# Patient Record
Sex: Female | Born: 1975 | Race: White | Hispanic: No | Smoking: Current every day smoker
Health system: Southern US, Community
[De-identification: ages and names within clinical notes are randomized; demographics above are authoritative.]

## PROBLEM LIST (undated history)

## (undated) DIAGNOSIS — N289 Disorder of kidney and ureter, unspecified: Secondary | ICD-10-CM

## (undated) DIAGNOSIS — F32A Depression, unspecified: Secondary | ICD-10-CM

## (undated) DIAGNOSIS — F419 Anxiety disorder, unspecified: Secondary | ICD-10-CM

## (undated) DIAGNOSIS — F329 Major depressive disorder, single episode, unspecified: Secondary | ICD-10-CM

---

## 2014-06-23 ENCOUNTER — Emergency Department: Payer: Self-pay | Admitting: Emergency Medicine

## 2014-06-23 LAB — BASIC METABOLIC PANEL
Anion Gap: 5 — ABNORMAL LOW (ref 7–16)
BUN: 8 mg/dL (ref 7–18)
CALCIUM: 8.4 mg/dL — AB (ref 8.5–10.1)
CHLORIDE: 102 mmol/L (ref 98–107)
CO2: 32 mmol/L (ref 21–32)
CREATININE: 0.78 mg/dL (ref 0.60–1.30)
EGFR (Non-African Amer.): >60
GLUCOSE: 96 mg/dL (ref 65–99)
Osmolality: 276 (ref 275–301)
Potassium: 3.9 mmol/L (ref 3.5–5.1)
SODIUM: 139 mmol/L (ref 136–145)

## 2014-06-23 LAB — CBC
HCT: 40.9 % (ref 35.0–47.0)
HGB: 12.9 g/dL (ref 12.0–16.0)
MCH: 26.8 pg (ref 26.0–34.0)
MCHC: 31.5 g/dL — AB (ref 32.0–36.0)
MCV: 85 fL (ref 80–100)
Platelet: 510 10*3/uL — ABNORMAL HIGH (ref 150–440)
RBC: 4.79 10*6/uL (ref 3.80–5.20)
RDW: 13.6 % (ref 11.5–14.5)
WBC: 12.6 10*3/uL — ABNORMAL HIGH (ref 3.6–11.0)

## 2014-06-23 LAB — TROPONIN I

## 2014-06-24 LAB — TROPONIN I: Troponin-I: <0.02 ng/mL

## 2014-10-03 ENCOUNTER — Emergency Department: Payer: Self-pay | Admitting: Emergency Medicine

## 2014-10-03 LAB — URINALYSIS, COMPLETE
BLOOD: NEGATIVE
Bacteria: NONE SEEN
Bilirubin,UR: NEGATIVE
Glucose,UR: NEGATIVE mg/dL (ref 0–75)
KETONE: NEGATIVE
Leukocyte Esterase: NEGATIVE
NITRITE: NEGATIVE
PH: 5 (ref 4.5–8.0)
Protein: NEGATIVE
RBC,UR: 3 /HPF (ref 0–5)
Specific Gravity: 1.019 (ref 1.003–1.030)
Squamous Epithelial: 6
WBC UR: 4 /HPF (ref 0–5)

## 2014-10-03 LAB — HCG, QUANTITATIVE, PREGNANCY

## 2014-10-03 LAB — CBC
HCT: 38.3 % (ref 35.0–47.0)
HGB: 12 g/dL (ref 12.0–16.0)
MCH: 23.9 pg — ABNORMAL LOW (ref 26.0–34.0)
MCHC: 31.3 g/dL — ABNORMAL LOW (ref 32.0–36.0)
MCV: 76 fL — ABNORMAL LOW (ref 80–100)
Platelet: 562 10*3/uL — ABNORMAL HIGH (ref 150–440)
RBC: 5.01 10*6/uL (ref 3.80–5.20)
RDW: 16 % — ABNORMAL HIGH (ref 11.5–14.5)
WBC: 10.7 10*3/uL (ref 3.6–11.0)

## 2014-12-26 ENCOUNTER — Emergency Department: Payer: Self-pay | Admitting: Student

## 2014-12-26 LAB — COMPREHENSIVE METABOLIC PANEL
ALBUMIN: 3.8 g/dL (ref 3.4–5.0)
ALK PHOS: 82 U/L
Anion Gap: 8 (ref 7–16)
BILIRUBIN TOTAL: 0.2 mg/dL (ref 0.2–1.0)
BUN: 12 mg/dL (ref 7–18)
CALCIUM: 8.8 mg/dL (ref 8.5–10.1)
CO2: 27 mmol/L (ref 21–32)
Chloride: 106 mmol/L (ref 98–107)
Creatinine: 0.8 mg/dL (ref 0.60–1.30)
EGFR (African American): >60
Glucose: 93 mg/dL (ref 65–99)
Osmolality: 281 (ref 275–301)
POTASSIUM: 3.3 mmol/L — AB (ref 3.5–5.1)
SGOT(AST): 18 U/L (ref 15–37)
SGPT (ALT): 25 U/L
SODIUM: 141 mmol/L (ref 136–145)
Total Protein: 7.4 g/dL (ref 6.4–8.2)

## 2014-12-26 LAB — URINALYSIS, COMPLETE
BACTERIA: NONE SEEN
Bilirubin,UR: NEGATIVE
Glucose,UR: NEGATIVE mg/dL (ref 0–75)
Ketone: NEGATIVE
Leukocyte Esterase: NEGATIVE
NITRITE: NEGATIVE
Ph: 7 (ref 4.5–8.0)
Protein: NEGATIVE
RBC,UR: 1 /HPF (ref 0–5)
Specific Gravity: 1.006 (ref 1.003–1.030)
Squamous Epithelial: 1
WBC UR: <1 /HPF (ref 0–5)

## 2014-12-26 LAB — CBC
HCT: 32.8 % — ABNORMAL LOW (ref 35.0–47.0)
HGB: 9.8 g/dL — AB (ref 12.0–16.0)
MCH: 19.5 pg — AB (ref 26.0–34.0)
MCHC: 30 g/dL — ABNORMAL LOW (ref 32.0–36.0)
MCV: 65 fL — ABNORMAL LOW (ref 80–100)
PLATELETS: 513 10*3/uL — AB (ref 150–440)
RBC: 5.03 10*6/uL (ref 3.80–5.20)
RDW: 17.2 % — ABNORMAL HIGH (ref 11.5–14.5)
WBC: 9.5 10*3/uL (ref 3.6–11.0)

## 2014-12-26 LAB — PREGNANCY, URINE: Pregnancy Test, Urine: NEGATIVE m[IU]/mL

## 2015-05-04 ENCOUNTER — Emergency Department: Payer: Self-pay

## 2015-05-04 ENCOUNTER — Emergency Department
Admission: EM | Admit: 2015-05-04 | Discharge: 2015-05-04 | Disposition: A | Discharge status: Home / Self Care | Payer: Self-pay | Attending: Emergency Medicine | Admitting: Emergency Medicine

## 2015-05-04 ENCOUNTER — Encounter: Payer: Self-pay | Admitting: Emergency Medicine

## 2015-05-04 DIAGNOSIS — Z720 Tobacco use: Secondary | ICD-10-CM | POA: Insufficient documentation

## 2015-05-04 DIAGNOSIS — Z79899 Other long term (current) drug therapy: Secondary | ICD-10-CM | POA: Insufficient documentation

## 2015-05-04 DIAGNOSIS — N12 Tubulo-interstitial nephritis, not specified as acute or chronic: Secondary | ICD-10-CM | POA: Insufficient documentation

## 2015-05-04 HISTORY — DX: Disorder of kidney and ureter, unspecified: N28.9

## 2015-05-04 LAB — COMPREHENSIVE METABOLIC PANEL
ALK PHOS: 74 U/L (ref 38–126)
ALT: 13 U/L — ABNORMAL LOW (ref 14–54)
AST: 19 U/L (ref 15–41)
Albumin: 4.3 g/dL (ref 3.5–5.0)
Anion gap: 6 (ref 5–15)
BUN: 11 mg/dL (ref 6–20)
CALCIUM: 9.1 mg/dL (ref 8.9–10.3)
CHLORIDE: 106 mmol/L (ref 101–111)
CO2: 25 mmol/L (ref 22–32)
Creatinine, Ser: 0.82 mg/dL (ref 0.44–1.00)
GFR calc Af Amer: >60 mL/min (ref >60)
GLUCOSE: 131 mg/dL — AB (ref 65–99)
POTASSIUM: 3.8 mmol/L (ref 3.5–5.1)
SODIUM: 137 mmol/L (ref 135–145)
Total Bilirubin: 0.4 mg/dL (ref 0.3–1.2)
Total Protein: 7.6 g/dL (ref 6.5–8.1)

## 2015-05-04 LAB — CBC
HCT: 33.8 % — ABNORMAL LOW (ref 35.0–47.0)
HEMOGLOBIN: 9.9 g/dL — AB (ref 12.0–16.0)
MCH: 18.1 pg — AB (ref 26.0–34.0)
MCHC: 29.4 g/dL — ABNORMAL LOW (ref 32.0–36.0)
MCV: 61.7 fL — ABNORMAL LOW (ref 80.0–100.0)
Platelets: 556 10*3/uL — ABNORMAL HIGH (ref 150–440)
RBC: 5.48 MIL/uL — AB (ref 3.80–5.20)
RDW: 20 % — ABNORMAL HIGH (ref 11.5–14.5)
WBC: 10.8 10*3/uL (ref 3.6–11.0)

## 2015-05-04 LAB — URINALYSIS COMPLETE WITH MICROSCOPIC (ARMC ONLY)
Bilirubin Urine: NEGATIVE
GLUCOSE, UA: NEGATIVE mg/dL
HGB URINE DIPSTICK: NEGATIVE
KETONES UR: NEGATIVE mg/dL
Nitrite: NEGATIVE
Protein, ur: 30 mg/dL — AB
Specific Gravity, Urine: 1.015 (ref 1.005–1.030)
pH: 5 (ref 5.0–8.0)

## 2015-05-04 MED ORDER — DEXTROSE 5 % IV SOLN
1.0000 g | Freq: Once | INTRAVENOUS | Status: DC
  Filled 2015-05-04: qty 10

## 2015-05-04 MED ORDER — HYDROCODONE-ACETAMINOPHEN 5-325 MG PO TABS
ORAL_TABLET | ORAL | Status: AC
  Administered 2015-05-04: 1 via ORAL
  Filled 2015-05-04: qty 1

## 2015-05-04 MED ORDER — SODIUM CHLORIDE 0.9 % IV BOLUS (SEPSIS)
1000.0000 mL | Freq: Once | INTRAVENOUS | Status: AC
  Administered 2015-05-04: 1000 mL via INTRAVENOUS

## 2015-05-04 MED ORDER — CEFTRIAXONE SODIUM IN DEXTROSE 20 MG/ML IV SOLN
INTRAVENOUS | Status: AC
  Administered 2015-05-04: 1000 mg
  Filled 2015-05-04: qty 50

## 2015-05-04 MED ORDER — AMOXICILLIN-POT CLAVULANATE 875-125 MG PO TABS: 1.0000 | ORAL_TABLET | Freq: Two times a day (BID) | ORAL | Status: AC

## 2015-05-04 MED ORDER — MORPHINE SULFATE 4 MG/ML IJ SOLN
INTRAMUSCULAR | Status: AC
  Administered 2015-05-04: 4 mg via INTRAVENOUS
  Filled 2015-05-04: qty 1

## 2015-05-04 MED ORDER — HYDROCODONE-ACETAMINOPHEN 5-325 MG PO TABS: 1.0000 | ORAL_TABLET | Freq: Four times a day (QID) | ORAL | Status: AC | PRN

## 2015-05-04 MED ORDER — MORPHINE SULFATE 4 MG/ML IJ SOLN
4.0000 mg | Freq: Once | INTRAMUSCULAR | Status: AC
  Administered 2015-05-04: 4 mg via INTRAVENOUS

## 2015-05-04 MED ORDER — HYDROCODONE-ACETAMINOPHEN 5-325 MG PO TABS
1.0000 | ORAL_TABLET | Freq: Once | ORAL | Status: AC
  Administered 2015-05-04: 1 via ORAL

## 2015-05-04 NOTE — ED Notes (Signed)
C/o right flank pain with painful urination and hematuria, states she has a hx of kidney problems and is supposed to be seeing nephrologist but just recently moved here

## 2015-05-04 NOTE — Discharge Instructions (Signed)
Take motrin for pain.   Take lortab for severe pain. Do NOT drive with it.  Take augmentin for a week.   Follow up with a doctor here.   Return to ER if you have severe pain, vomiting, fever.

## 2015-05-04 NOTE — ED Provider Notes (Signed)
CSN: [id number]     Arrival date & time 05/04/15  1538 History   First MD Initiated Contact with Patient 05/04/15 1619     Chief Complaint  Patient presents with  . Flank Pain  . Hematuria  . Dysuria     (Consider location/radiation/quality/duration/timing/severity/associated sxs/prior Treatment) The history is provided by the patient.  Mary Morales is a 39 y.o. female hx medullary sponge kidney, anxiety here presenting with right flank pain, dysuria. Dysuria about a week and a half ago and resolved several days ago. Yesterday she had acute onset of worsening dysuria as well as frequency. Also has worsening right sided flank pain. Had some dark urine but denies any history of kidney stones. Denies any vomiting or fevers. Of note patient was diagnosed with medullary sponge kidney in [place] and had to relocate here so has no follow-up currently.   Past Medical History  Diagnosis Date  . Kidney disease    No past surgical history on file. No family history on file. History  Substance Use Topics  . Smoking status: Current Every Day Smoker    Types: Cigarettes  . Smokeless tobacco: Not on file  . Alcohol Use: Yes   OB History    No data available     Review of Systems  Genitourinary: Positive for dysuria, hematuria and flank pain.  All other systems reviewed and are negative.     Allergies  Buspar  Home Medications   Prior to Admission medications   Medication Sig Start Date End Date Taking? Authorizing Provider  amphetamine-dextroamphetamine (ADDERALL) 15 MG tablet Take 15 mg by mouth daily.   Yes Historical Provider, MD  azelastine (ASTELIN) 0.1 % nasal spray Place 2 sprays into both nostrils 2 (two) times daily. Use in each nostril as directed   Yes Historical Provider, MD  FLUoxetine (PROZAC) 20 MG capsule Take 20 mg by mouth daily.   Yes Historical Provider, MD  Multiple Vitamins-Minerals (MULTIVITAMIN ADULT) TABS Take 1 tablet by mouth daily.   Yes Historical  Provider, MD   BP 126/88 mmHg  Pulse 87  Temp(Src) 98.3 F (36.8 C) (Oral)  Resp 18  Ht  (1.778 m)  Wt 178 lb (80.74 kg)  BMI 25.54 kg/m2  SpO2 97%  LMP 04/27/2015 Physical Exam  Constitutional: She is oriented to person, place, and time.  Slightly uncomfortable   HENT:  Head: Normocephalic.  Mouth/Throat: Oropharynx is clear and moist.  Eyes: Conjunctivae are normal. Pupils are equal, round, and reactive to light.  Neck: Normal range of motion. Neck supple.  Cardiovascular: Normal rate, regular rhythm and normal heart sounds.   Pulmonary/Chest: Effort normal and breath sounds normal. No respiratory distress. She has no wheezes. She has no rales.  Abdominal: Soft. Bowel sounds are normal.  +mild suprapubic tenderness. Mild R CVAT   Musculoskeletal: Normal range of motion. She exhibits no edema or tenderness.  Neurological: She is alert and oriented to person, place, and time. No cranial nerve deficit. Coordination normal.  Skin: Skin is warm and dry.  Psychiatric: She has a normal mood and affect. Her behavior is normal. Judgment and thought content normal.  Nursing note and vitals reviewed.   ED Course  Procedures (including critical care time) Labs Review Labs Reviewed  CBC - Abnormal; Notable for the following:    RBC 5.48 (*)    Hemoglobin 9.9 (*)    HCT 33.8 (*)    MCV 61.7 (*)    MCH 18.1 (*)    MCHC  29.4 (*)    RDW 20.0 (*)    Platelets 556 (*)    All other components within normal limits  URINALYSIS COMPLETEWITH MICROSCOPIC ([facility] ONLY) - Abnormal; Notable for the following:    Color, Urine YELLOW (*)    APPearance HAZY (*)    Protein, ur 30 (*)    Leukocytes, UA 2+ (*)    Bacteria, UA RARE (*)    Squamous Epithelial / LPF 6-30 (*)    All other components within normal limits  COMPREHENSIVE METABOLIC PANEL - Abnormal; Notable for the following:    Glucose, Bld 131 (*)    ALT 13 (*)    All other components within normal limits    Imaging  Review [facility]  05/04/2015   CLINICAL DATA:  Right flank pain for 2 weeks. Gross hematuria. History of medullary sponge kidney. Initial encounter.  EXAM: RENAL / URINARY TRACT ULTRASOUND COMPLETE  COMPARISON:  Abdominal CT 07/04/2011.  FINDINGS: Right Kidney:  Length: 12.4 cm. There is increased echogenicity within the medullary portion of the kidney without discrete calculi. No cortical lesion or hydronephrosis.  Left Kidney:  Length: 12.4 cm. There is increased echogenicity within the medullary portion of the kidney without discrete calculi. No cortical lesion or hydronephrosis.  Bladder:  Appears normal for degree of bladder distention.  IMPRESSION: 1. Both kidneys demonstrate increased echogenicity within the medullary portions which may be related to known medullary sponge kidney. No discrete calculi identified. 2. No evidence of hydronephrosis.   Electronically Signed   By: Carey BullocksWilliam  Veazey M.D.   On: 05/04/2015 17:42     EKG Interpretation None      MDM   Final diagnoses:  None    Mary PianoHolly Croy is a 39 y.o. female here with R flank pain. Likely pyelo vs renal colic. Able to review outside records. Had CT in 2014 that was unremarkable. US renal showed medullary sponge kidney but didn't get a biopsy yet. Will get labs, UA, US renal.   6:21 PM UA + UTI. US showed no stone. Pain controlled. Will dc home with augmentin, lortab.    Richardean Canalavid H Yao, MD 05/04/15 [phone]

## 2016-04-16 ENCOUNTER — Encounter: Payer: Self-pay | Admitting: Emergency Medicine

## 2016-04-16 ENCOUNTER — Emergency Department: Payer: Self-pay

## 2016-04-16 ENCOUNTER — Emergency Department
Admission: EM | Admit: 2016-04-16 | Discharge: 2016-04-16 | Disposition: A | Discharge status: Home / Self Care | Payer: Self-pay | Attending: Emergency Medicine | Admitting: Emergency Medicine

## 2016-04-16 DIAGNOSIS — F32A Depression, unspecified: Secondary | ICD-10-CM

## 2016-04-16 DIAGNOSIS — R0789 Other chest pain: Secondary | ICD-10-CM | POA: Insufficient documentation

## 2016-04-16 DIAGNOSIS — Z792 Long term (current) use of antibiotics: Secondary | ICD-10-CM | POA: Insufficient documentation

## 2016-04-16 DIAGNOSIS — F329 Major depressive disorder, single episode, unspecified: Secondary | ICD-10-CM | POA: Insufficient documentation

## 2016-04-16 DIAGNOSIS — F17210 Nicotine dependence, cigarettes, uncomplicated: Secondary | ICD-10-CM | POA: Insufficient documentation

## 2016-04-16 DIAGNOSIS — Z79899 Other long term (current) drug therapy: Secondary | ICD-10-CM | POA: Insufficient documentation

## 2016-04-16 HISTORY — DX: Depression, unspecified: F32.A

## 2016-04-16 HISTORY — DX: Major depressive disorder, single episode, unspecified: F32.9

## 2016-04-16 HISTORY — DX: Anxiety disorder, unspecified: F41.9

## 2016-04-16 LAB — CBC
HCT: 33.4 % — ABNORMAL LOW (ref 35.0–47.0)
HEMOGLOBIN: 10.2 g/dL — AB (ref 12.0–16.0)
MCH: 20.2 pg — AB (ref 26.0–34.0)
MCHC: 30.6 g/dL — ABNORMAL LOW (ref 32.0–36.0)
MCV: 66.2 fL — AB (ref 80.0–100.0)
Platelets: 589 10*3/uL — ABNORMAL HIGH (ref 150–440)
RBC: 5.05 MIL/uL (ref 3.80–5.20)
RDW: 18.6 % — ABNORMAL HIGH (ref 11.5–14.5)
WBC: 12.1 10*3/uL — ABNORMAL HIGH (ref 3.6–11.0)

## 2016-04-16 LAB — BASIC METABOLIC PANEL WITH GFR
Anion gap: 8 (ref 5–15)
BUN: 13 mg/dL (ref 6–20)
CO2: 24 mmol/L (ref 22–32)
Calcium: 9 mg/dL (ref 8.9–10.3)
Chloride: 102 mmol/L (ref 101–111)
Creatinine, Ser: 0.7 mg/dL (ref 0.44–1.00)
GFR calc Af Amer: >60 mL/min
GFR calc non Af Amer: >60 mL/min
Glucose, Bld: 119 mg/dL — ABNORMAL HIGH (ref 65–99)
Potassium: 3.4 mmol/L — ABNORMAL LOW (ref 3.5–5.1)
Sodium: 134 mmol/L — ABNORMAL LOW (ref 135–145)

## 2016-04-16 LAB — TROPONIN I: Troponin I: <0.03 ng/mL

## 2016-04-16 MED ORDER — FLUOXETINE HCL 20 MG PO CAPS: 20.0000 mg | ORAL_CAPSULE | Freq: Every day | ORAL | Status: AC

## 2016-04-16 NOTE — ED Notes (Signed)
AAOx3.  Skin warm and dry.  NAD 

## 2016-04-16 NOTE — Discharge Instructions (Signed)
As we discussed, your medical workup was reassuring today.  We believe that your symptoms are more likely related to depression and anxiety and all of the stressors with which your dealing right now.  We recommend that you follow-up with [facility] for mental health care as well as possibly contacting the [facility] or the toll-free number listed to establish a new primary care doctor that you can afford and who can help you with your chronic medical issues.  Return to the emergency department if you develop new or worsening medical symptoms that concern you or if your depression worsens and you start to consider harming yourself or others.   Major Depressive Disorder Major depressive disorder is a mental illness. It also may be called clinical depression or unipolar depression. Major depressive disorder usually causes feelings of sadness, hopelessness, or helplessness. Some people with this disorder do not feel particularly sad but lose interest in doing things they used to enjoy (anhedonia). Major depressive disorder also can cause physical symptoms. It can interfere with work, school, relationships, and other normal everyday activities. The disorder varies in severity but is longer lasting and more serious than the sadness we all feel from time to time in our lives. Major depressive disorder often is triggered by stressful life events or major life changes. Examples of these triggers include divorce, loss of your job or home, a move, and the death of a family member or close friend. Sometimes this disorder occurs for no obvious reason at all. People who have family members with major depressive disorder or bipolar disorder are at higher risk for developing this disorder, with or without life stressors. Major depressive disorder can occur at any age. It may occur just once in your life (single episode major depressive disorder). It may occur multiple times (recurrent major depressive  disorder). SYMPTOMS People with major depressive disorder have either anhedonia or depressed mood on nearly a daily basis for at least 2 weeks or longer. Symptoms of depressed mood include:  Feelings of sadness (blue or down in the dumps) or emptiness.  Feelings of hopelessness or helplessness.  Tearfulness or episodes of crying (may be observed by others).  Irritability (children and adolescents). In addition to depressed mood or anhedonia or both, people with this disorder have at least four of the following symptoms:  Difficulty sleeping or sleeping too much.   Significant change (increase or decrease) in appetite or weight.   Lack of energy or motivation.  Feelings of guilt and worthlessness.   Difficulty concentrating, remembering, or making decisions.  Unusually slow movement (psychomotor retardation) or restlessness (as observed by others).   Recurrent wishes for death, recurrent thoughts of self-harm (suicide), or a suicide attempt. People with major depressive disorder commonly have persistent negative thoughts about themselves, other people, and the world. People with severe major depressive disorder may experiencedistorted beliefs or perceptions about the world (psychotic delusions). They also may see or hear things that are not real (psychotic hallucinations). DIAGNOSIS Major depressive disorder is diagnosed through an assessment by your health care provider. Your health care provider will ask aboutaspects of your daily life, such as mood,sleep, and appetite, to see if you have the diagnostic symptoms of major depressive disorder. Your health care provider may ask about your medical history and use of alcohol or drugs, including prescription medicines. Your health care provider also may do a physical exam and blood work. This is because certain medical conditions and the use of certain substances can cause major  depressive disorder-like symptoms (secondary depression).  Your health care provider also may refer you to a mental health specialist for further evaluation and treatment. TREATMENT It is important to recognize the symptoms of major depressive disorder and seek treatment. The following treatments can be prescribed for this disorder:   Medicine. Antidepressant medicines usually are prescribed. Antidepressant medicines are thought to correct chemical imbalances in the brain that are commonly associated with major depressive disorder. Other types of medicine may be added if the symptoms do not respond to antidepressant medicines alone or if psychotic delusions or hallucinations occur.  Talk therapy. Talk therapy can be helpful in treating major depressive disorder by providing support, education, and guidance. Certain types of talk therapy also can help with negative thinking (cognitive behavioral therapy) and with relationship issues that trigger this disorder (interpersonal therapy). A mental health specialist can help determine which treatment is best for you. Most people with major depressive disorder do well with a combination of medicine and talk therapy. Treatments involving electrical stimulation of the brain can be used in situations with extremely severe symptoms or when medicine and talk therapy do not work over time. These treatments include electroconvulsive therapy, transcranial magnetic stimulation, and vagal nerve stimulation.   This information is not intended to replace advice given to you by your health care provider. Make sure you discuss any questions you have with your health care provider.   Document Released: 03/15/2013 Document Revised: 12/09/2014 Document Reviewed: 03/15/2013 Elsevier Interactive Patient Education 2016 [employer]  Nonspecific Chest Pain  Chest pain can be caused by many different conditions. There is always a chance that your pain could be related to something serious, such as a heart attack or a blood clot in your  lungs. Chest pain can also be caused by conditions that are not life-threatening. If you have chest pain, it is very important to follow up with your health care provider. CAUSES  Chest pain can be caused by:  Heartburn.  Pneumonia or bronchitis.  Anxiety or stress.  Inflammation around your heart (pericarditis) or lung (pleuritis or pleurisy).  A blood clot in your lung.  A collapsed lung (pneumothorax). It can develop suddenly on its own (spontaneous pneumothorax) or from trauma to the chest.  Shingles infection (varicella-zoster virus).  Heart attack.  Damage to the bones, muscles, and cartilage that make up your chest wall. This can include:  Bruised bones due to injury.  Strained muscles or cartilage due to frequent or repeated coughing or overwork.  Fracture to one or more ribs.  Sore cartilage due to inflammation (costochondritis). RISK FACTORS  Risk factors for chest pain may include:  Activities that increase your risk for trauma or injury to your chest.  Respiratory infections or conditions that cause frequent coughing.  Medical conditions or overeating that can cause heartburn.  Heart disease or family history of heart disease.  Conditions or health behaviors that increase your risk of developing a blood clot.  Having had chicken pox (varicella zoster). SIGNS AND SYMPTOMS Chest pain can feel like:  Burning or tingling on the surface of your chest or deep in your chest.  Crushing, pressure, aching, or squeezing pain.  Dull or sharp pain that is worse when you move, cough, or take a deep breath.  Pain that is also felt in your back, neck, shoulder, or arm, or pain that spreads to any of these areas. Your chest pain may come and go, or it may stay constant. DIAGNOSIS Lab tests or other  studies may be needed to find the cause of your pain. Your health care provider may have you take a test called an ambulatory ECG (electrocardiogram). An ECG records your  heartbeat patterns at the time the test is performed. You may also have other tests, such as:  Transthoracic echocardiogram (TTE). During echocardiography, sound waves are used to create a picture of all of the heart structures and to look at how blood flows through your heart.  Transesophageal echocardiogram (TEE).This is a more advanced imaging test that obtains images from inside your body. It allows your health care provider to see your heart in finer detail.  Cardiac monitoring. This allows your health care provider to monitor your heart rate and rhythm in real time.  Holter monitor. This is a portable device that records your heartbeat and can help to diagnose abnormal heartbeats. It allows your health care provider to track your heart activity for several days, if needed.  Stress tests. These can be done through exercise or by taking medicine that makes your heart beat more quickly.  Blood tests.  Imaging tests. TREATMENT  Your treatment depends on what is causing your chest pain. Treatment may include:  Medicines. These may include:  Acid blockers for heartburn.  Anti-inflammatory medicine.  Pain medicine for inflammatory conditions.  Antibiotic medicine, if an infection is present.  Medicines to dissolve blood clots.  Medicines to treat coronary artery disease.  Supportive care for conditions that do not require medicines. This may include:  Resting.  Applying heat or cold packs to injured areas.  Limiting activities until pain decreases. HOME CARE INSTRUCTIONS  If you were prescribed an antibiotic medicine, finish it all even if you start to feel better.  Avoid any activities that bring on chest pain.  Do not use any tobacco products, including cigarettes, chewing tobacco, or electronic cigarettes. If you need help quitting, ask your health care provider.  Do not drink alcohol.  Take medicines only as directed by your health care provider.  Keep all  follow-up visits as directed by your health care provider. This is important. This includes any further testing if your chest pain does not go away.  If heartburn is the cause for your chest pain, you may be told to keep your head raised (elevated) while sleeping. This reduces the chance that acid will go from your stomach into your esophagus.  Make lifestyle changes as directed by your health care provider. These may include:  Getting regular exercise. Ask your health care provider to suggest some activities that are safe for you.  Eating a heart-healthy diet. A registered dietitian can help you to learn healthy eating options.  Maintaining a healthy weight.  Managing diabetes, if necessary.  Reducing stress. SEEK MEDICAL CARE IF:  Your chest pain does not go away after treatment.  You have a rash with blisters on your chest.  You have a fever. SEEK IMMEDIATE MEDICAL CARE IF:   Your chest pain is worse.  You have an increasing cough, or you cough up blood.  You have severe abdominal pain.  You have severe weakness.  You faint.  You have chills.  You have sudden, unexplained chest discomfort.  You have sudden, unexplained discomfort in your arms, back, neck, or jaw.  You have shortness of breath at any time.  You suddenly start to sweat, or your skin gets clammy.  You feel nauseous or you vomit.  You suddenly feel light-headed or dizzy.  Your heart begins to beat quickly,  or it feels like it is skipping beats. These symptoms may represent a serious problem that is an emergency. Do not wait to see if the symptoms will go away. Get medical help right away. Call your local emergency services (911 in the [place] Do not drive yourself to the hospital.   This information is not intended to replace advice given to you by your health care provider. Make sure you discuss any questions you have with your health care provider.   Document Released: 08/28/2005 Document Revised:  12/09/2014 Document Reviewed: 06/24/2014 Elsevier Interactive Patient Education [employer]

## 2016-04-16 NOTE — ED Notes (Signed)
Pt to ed with c/o intermittent chest pain that started 3 days ago also reports weakness and dizziness associated with the pain.  Pt states hx of anemia and reports this feels the same.

## 2016-04-16 NOTE — ED Provider Notes (Signed)
[facility] Emergency Department Provider Note  ____________________________________________  Time seen: Approximately 6:24 PM  I have reviewed the triage vital signs and the nursing notes.   HISTORY  Chief Complaint Chest Pain    HPI Mary Morales is a 40 y.o. female with a history of anxiety and depression who presents with about a week of generalized fatigue, disinterest in daily activities, generalized weakness, and intermittent central aching mild chest pain.  Nothing in particular makes it better or worse.  She states that she has a history of anemia requiring a blood transfusion in the past and she feels somewhat similar to that.  She also wonders about it being her depression because she ran out of fluoxetine about 2 weeks ago and no longer has a psychiatrist who can prescribe it.  She has not been able to see her primary care doctor due to financial issues.  She is under a lot of stress at home with multiple family members having problems including a pregnant daughter, her daughter's boyfriend is a drug addict, and her mother has cancer.  She states that everyone is relying on her butt that she feels like she is falling apart.  She denies suicidal ideation and homicidal ideation but does endorse worsening depression gradually over an extended period of time.  She denies shortness of breath, nausea, vomiting, abdominal pain, dysuria.  She has not had any recent surgeries, she does not take estrogen supplements, and has never had a history of blood clots.  She has not had any swelling or pain in her legs.   Past Medical History  Diagnosis Date  . Kidney disease   . Anxiety   . Depression     There are no active problems to display for this patient.   History reviewed. No pertinent past surgical history.  Current Outpatient Rx  Name  Route  Sig  Dispense  Refill  . amoxicillin-clavulanate (AUGMENTIN) 875-125 MG per tablet   Oral   Take 1 tablet by  mouth 2 (two) times daily. One po bid x 7 days   14 tablet   0   . amphetamine-dextroamphetamine (ADDERALL) 15 MG tablet   Oral   Take 15 mg by mouth daily.         Marland Kitchen. azelastine (ASTELIN) 0.1 % nasal spray   Each Nare   Place 2 sprays into both nostrils 2 (two) times daily. Use in each nostril as directed         . FLUoxetine (PROZAC) 20 MG capsule   Oral   Take 1 capsule (20 mg total) by mouth daily.   30 capsule   2   . HYDROcodone-acetaminophen (LORTAB) 5-325 MG per tablet   Oral   Take 1 tablet by mouth every 6 (six) hours as needed for moderate pain.   12 tablet   0   . Multiple Vitamins-Minerals (MULTIVITAMIN ADULT) TABS   Oral   Take 1 tablet by mouth daily.           Allergies Buspar  History reviewed. No pertinent family history.  Social History Social History  Substance Use Topics  . Smoking status: Current Every Day Smoker    Types: Cigarettes  . Smokeless tobacco: None  . Alcohol Use: Yes    Review of Systems Constitutional: No fever/chills Eyes: No visual changes. ENT: No sore throat. Cardiovascular: +CP Respiratory: Denies shortness of breath. Gastrointestinal: No abdominal pain.  No nausea, no vomiting.  No diarrhea.  No constipation. Genitourinary:  Negative for dysuria. Musculoskeletal: Negative for back pain. Skin: Negative for rash. Neurological: Negative for headaches, focal weakness or numbness.  Generalized fatigue and weakness  10-point ROS otherwise negative.  ____________________________________________   PHYSICAL EXAM:  VITAL SIGNS: ED Triage Vitals  Enc Vitals Group     BP 04/16/16 1718 133/87 mmHg     Pulse Rate 04/16/16 1718 98     Resp 04/16/16 1718 20     Temp 04/16/16 1718 98.4 F (36.9 C)     Temp Source 04/16/16 1718 Oral     SpO2 04/16/16 1718 98 %     Weight 04/16/16 1718 170 lb (77.111 kg)     Height 04/16/16 1718  (1.778 m)     Head Cir --      Peak Flow --      Pain Score 04/16/16 1718 5      Pain Loc --      Pain Edu? --      Excl. in GC? --     Constitutional: Alert and oriented. Well appearing and in no acute distress. Eyes: Conjunctivae are normal. PERRL. EOMI. Head: Atraumatic. Nose: No congestion/rhinnorhea. Mouth/Throat: Mucous membranes are moist.  Oropharynx non-erythematous. Neck: No stridor.  No meningeal signs.   Cardiovascular: Normal rate, regular rhythm. Good peripheral circulation. Grossly normal heart sounds.   Respiratory: Normal respiratory effort.  No retractions. Lungs CTAB. Gastrointestinal: Soft and nontender. No distention.  Musculoskeletal: No lower extremity tenderness nor edema. No gross deformities of extremities. Neurologic:  Normal speech and language. No gross focal neurologic deficits are appreciated.  Skin:  Skin is warm, dry and intact. No rash noted. Psychiatric: Mood and affect are tearful and depressed, but she denies SI/HI.  ____________________________________________   LABS (all labs ordered are listed, but only abnormal results are displayed)  Labs Reviewed  BASIC METABOLIC PANEL - Abnormal; Notable for the following:    Sodium 134 (*)    Potassium 3.4 (*)    Glucose, Bld 119 (*)    All other components within normal limits  CBC - Abnormal; Notable for the following:    WBC 12.1 (*)    Hemoglobin 10.2 (*)    HCT 33.4 (*)    MCV 66.2 (*)    MCH 20.2 (*)    MCHC 30.6 (*)    RDW 18.6 (*)    Platelets 589 (*)    All other components within normal limits  TROPONIN I   ____________________________________________  EKG  ED ECG REPORT I, Syana Degraffenreid, the attending physician, personally viewed and interpreted this ECG.  Date: 04/16/2016 EKG Time: 17:16 Rate: 96 Rhythm: normal sinus rhythm QRS Axis: normal Intervals: normal ST/T Wave abnormalities: normal Conduction Disturbances: none Narrative Interpretation: unremarkable  ____________________________________________  RADIOLOGY   Dg Chest 2  View  04/16/2016  CLINICAL DATA:  Increasing weakness, cough, central chest pain, pain and swelling RIGHT forearm, smoker EXAM: CHEST  2 VIEW COMPARISON:  12/26/2014 FINDINGS: Normal heart size, mediastinal contours, and pulmonary vascularity. Lungs clear. No pleural effusion or pneumothorax. Bones unremarkable. IMPRESSION: Normal exam. Electronically Signed   By: Ulyses Southward M.D.   On: 04/16/2016 18:27    ____________________________________________   PROCEDURES  Procedure(s) performed: None  Critical Care performed: No ____________________________________________   INITIAL IMPRESSION / ASSESSMENT AND PLAN / ED COURSE  Pertinent labs & imaging results that were available during my care of the patient were reviewed by me and considered in my medical decision making (see chart for details).  Pulmonary Embolism Rule-out Criteria (PERC rule)                        If YES to ANY of the following, the PERC rule is not satisfied and cannot be used to rule out PE in this patient (consider d-dimer or imaging depending on pre-test probability).                      If NO to ALL of the following, AND the clinician's pre-test probability is <15%, the [facility] rule is satisfied and there is no need for further workup (including no need to obtain a d-dimer) as the post-test probability of pulmonary embolism is <2%.                      Mnemonic is HAD CLOTS   H - hormone use (exogenous estrogen)      No. A - age > 50                                                 No. D - DVT/PE history                                      No.   C - coughing blood (hemoptysis)                 No. L - leg swelling, unilateral                             No. O - O2 Sat on Room Air < 95%                  No. T - tachycardia (HR ? 100)                         No. S - surgery or trauma, recent                      No.   Based on my evaluation of the patient, including application of this decision instrument,  further testing to evaluate for pulmonary embolism is not indicated at this time. I have discussed this recommendation with the patient who states understanding and agreement with this plan.  I believe that the patient's symptoms are likely due to her current psychiatric issues.  We had an extensive discussion about this and I do not believe she represents a danger to herself or others at this time, but she definitely needs outpatient follow-up.  I gave her information about [facility] as well as information about how to find a new primary care doctor if she needs to do so.  She has been on Prozac successfully in the past and the fact that she has been off of it for about 2 weeks may be exacerbating her symptoms.  She was on a relatively low adult dose so I am starting her back on her 20 mg fluoxetine qDay dose.  I did stress the importance of outpatient follow-up.  I also gave my usual and customary discussion and return precautions for atypical chest pain.  She understands and  agrees with the plan.   ____________________________________________  FINAL CLINICAL IMPRESSION(S) / ED DIAGNOSES  Final diagnoses:  Atypical chest pain  Depression     MEDICATIONS GIVEN DURING THIS VISIT:  Medications - No data to display   NEW OUTPATIENT MEDICATIONS STARTED DURING THIS VISIT:  New Prescriptions   No medications on file    Modified Medications   Modified Medication Previous Medication   FLUOXETINE (PROZAC) 20 MG CAPSULE FLUoxetine (PROZAC) 20 MG capsule      Take 1 capsule (20 mg total) by mouth daily.    Take 20 mg by mouth daily.      Note:  This document was prepared using Dragon voice recognition software and may include unintentional dictation errors.   Loleta Rose, MD 04/16/16 [phone]

## 2016-06-04 IMAGING — MR MRI HEAD WITHOUT AND WITH CONTRAST
12 series · 48 of 48 positions shown · IV contrast (magnevist)
Comparison: None.

CLINICAL DATA: Severe headache for 1 day, vision changes for 1 day.
Hand and feet swelling for 1 day.

EXAM:
MRI HEAD WITHOUT AND WITH CONTRAST
TECHNIQUE: Multiplanar, multiecho pulse sequences of the brain and surrounding
structures were obtained without and with intravenous contrast.
CONTRAST:  16 cc Magnevist

[Series 2: T1 · sagittal · 5.0mm · 0.45mm/px · 2 of 27 slices shown (1 of 2)]
[im 1/27]
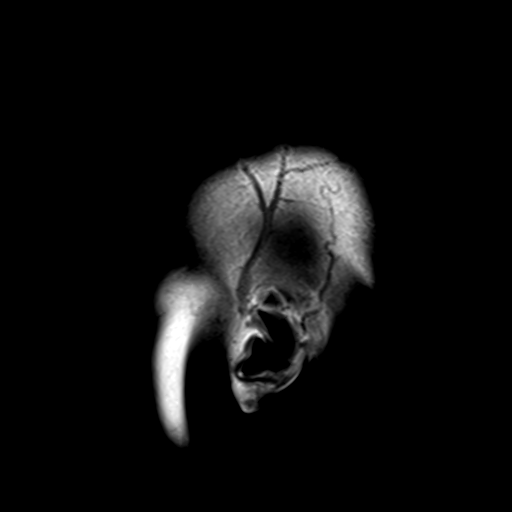
[im 27/27]
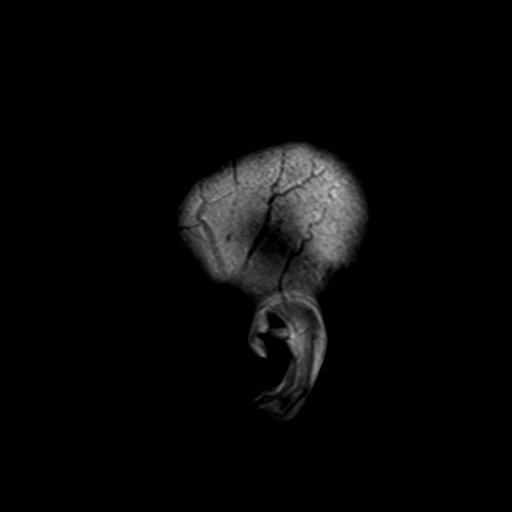

[Series 4: DWI · axial · 3.0mm · 1.80mm/px · z∈[-73,+89]mm · 4 of 51 slices shown (1 of 4)]
[im 1/51]
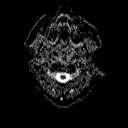
[im 17/51]
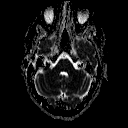
[im 34/51]
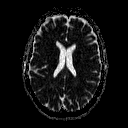
[im 51/51]
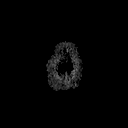

[Series 6: DWI · coronal · 3.0mm · 1.80mm/px · 5 of 48 slices shown (2 of 4)]
[im 1/48]
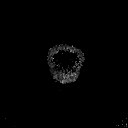
[im 12/48]
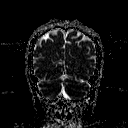
[im 24/48]
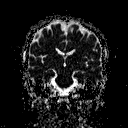
[im 36/48]
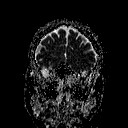
[im 48/48]
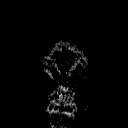

[Series 7: T2 · axial · 5.0mm · 0.60mm/px · z∈[-76,+92]mm · 3 of 27 slices shown (1 of 2)]
[im 1/27]
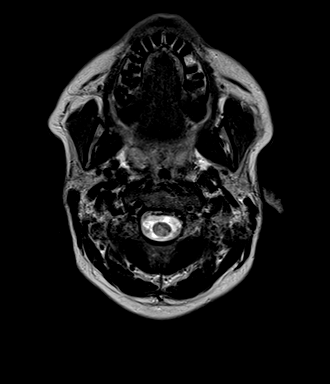
[im 14/27]
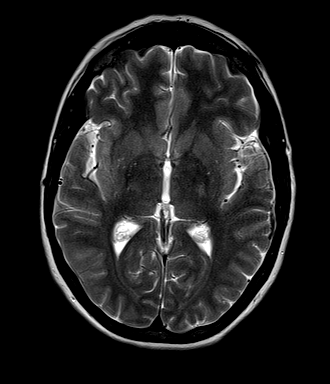
[im 27/27]
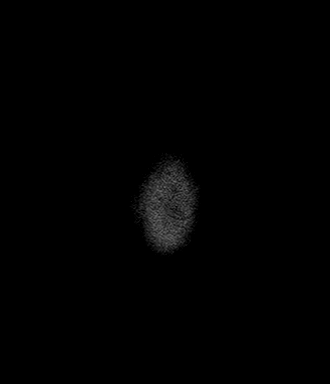

[Series 8: FLAIR · axial · 5.0mm · 0.45mm/px · z∈[-76,+92]mm · 3 of 27 slices shown]
[im 1/27]
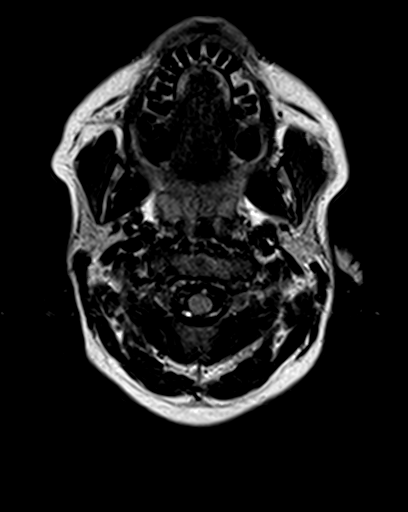
[im 14/27]
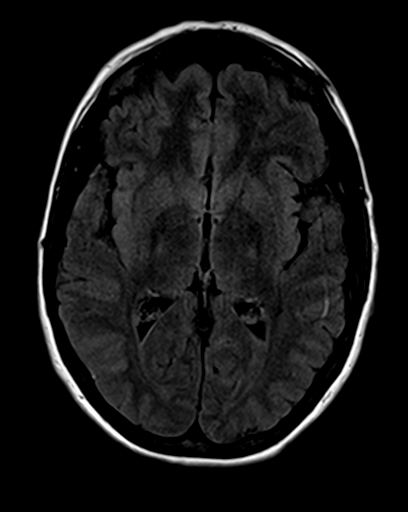
[im 27/27]
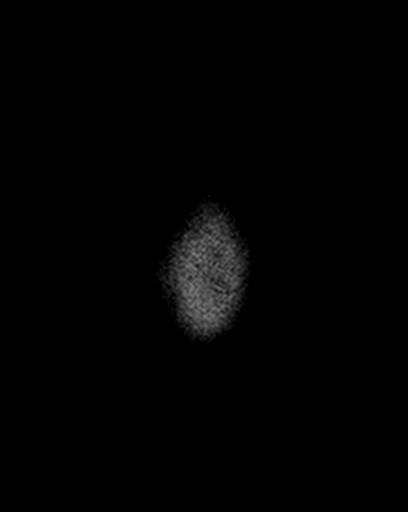

[Series 9: T2 · axial · 5.0mm · 0.45mm/px · z∈[-76,+92]mm · 3 of 27 slices shown (2 of 2)]
[im 1/27]
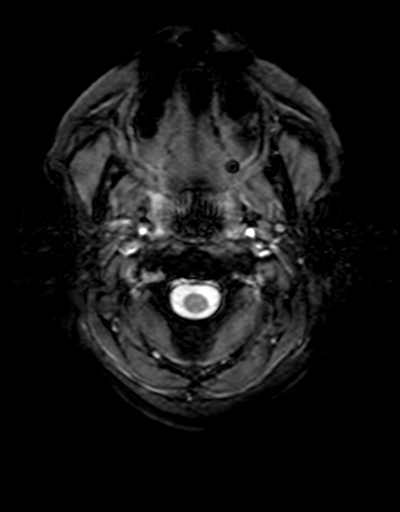
[im 14/27]
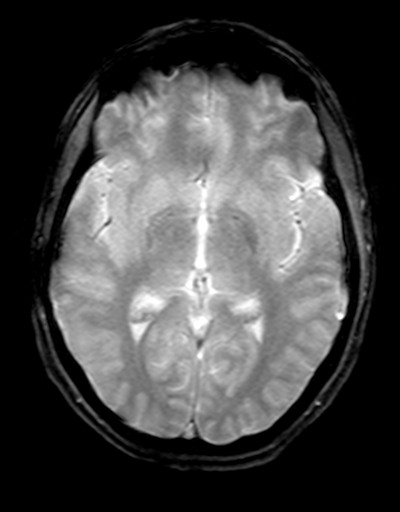
[im 27/27]
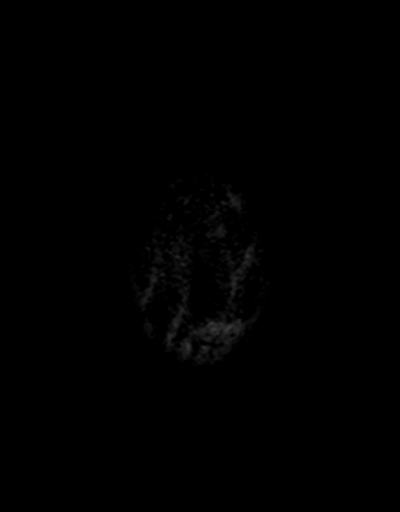

[Series 10: T1 · axial · 3.0mm · 1.00mm/px · z∈[-86,+103]mm · 6 of 64 slices shown (2 of 2)]
[im 1/64]
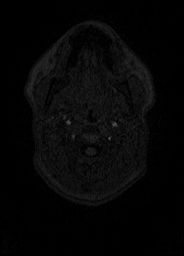
[im 13/64]
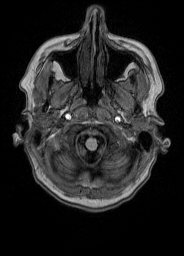
[im 26/64]
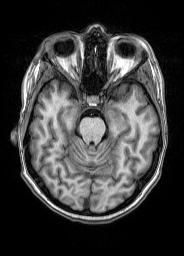
[im 38/64]
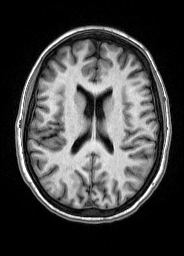
[im 51/64]
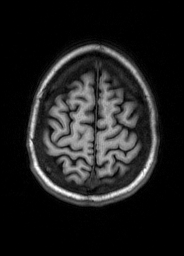
[im 64/64]
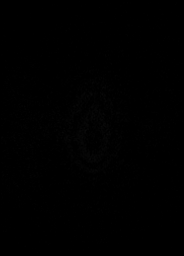

[Series 11: T2 post-contrast · coronal · 5.0mm · 0.49mm/px · 3 of 30 slices shown]
[im 1/30]
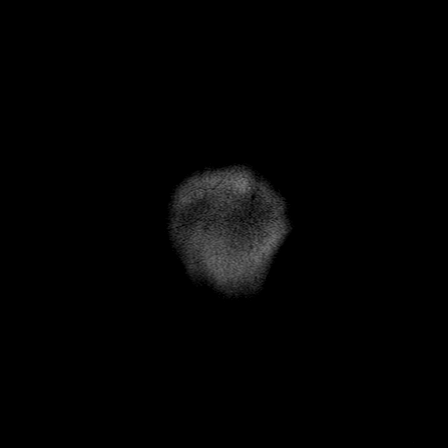
[im 15/30]
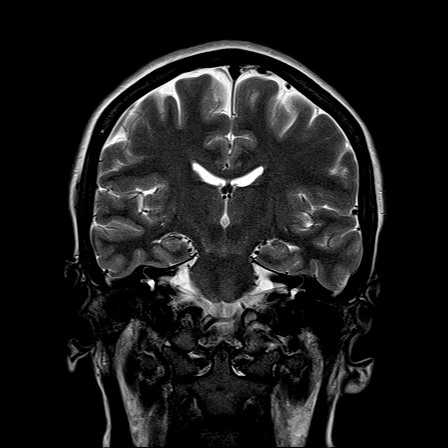
[im 30/30]
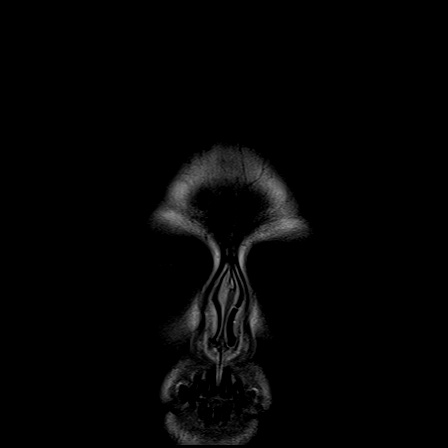

[Series 12: T1 post-contrast · axial · 3.0mm · 1.00mm/px · z∈[-86,+103]mm · 6 of 64 slices shown (1 of 2)]
[im 1/64]
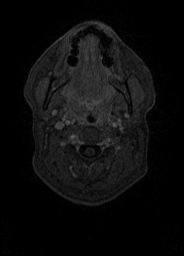
[im 13/64]
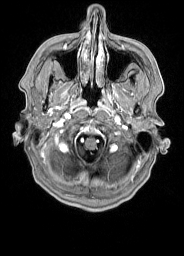
[im 26/64]
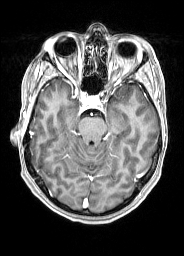
[im 38/64]
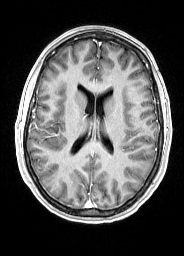
[im 51/64]
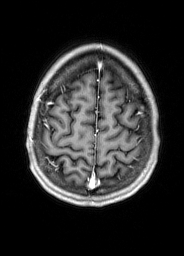
[im 64/64]
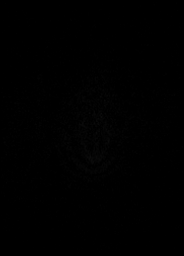

[Series 13: T1 post-contrast · coronal · 5.0mm · 0.43mm/px · 3 of 30 slices shown (2 of 2)]
[im 1/30]
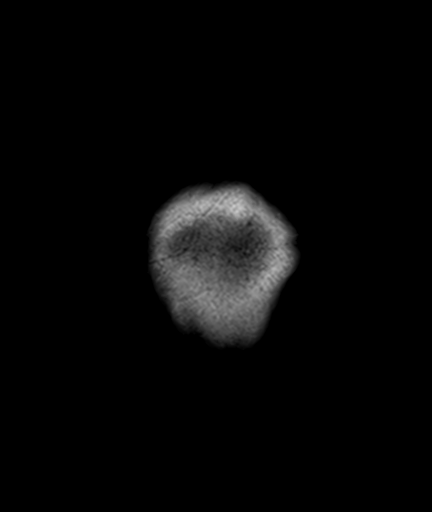
[im 15/30]
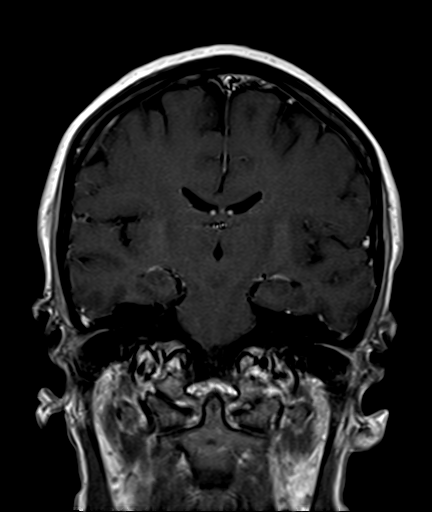
[im 30/30]
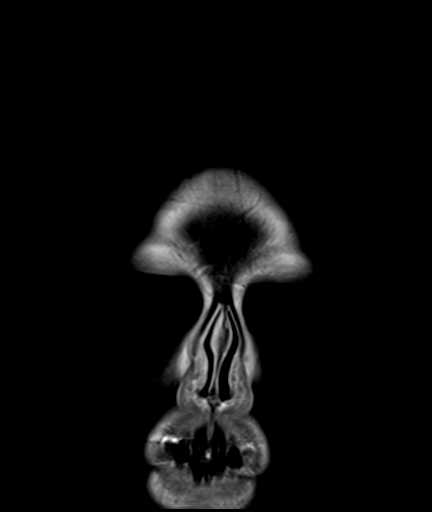

[Series 100: DWI · axial · 3.0mm · 1.80mm/px · z∈[-73,+89]mm · 5 of 55 slices shown (3 of 4)]
[im 1/55]
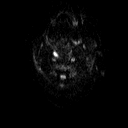
[im 14/55]
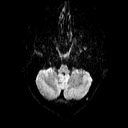
[im 28/55]
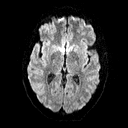
[im 41/55]
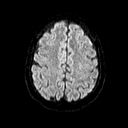
[im 55/55]
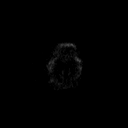

[Series 101: DWI · coronal · 3.0mm · 1.80mm/px · 5 of 48 slices shown (4 of 4)]
[im 1/48]
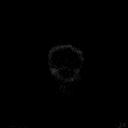
[im 12/48]
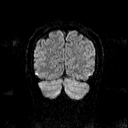
[im 24/48]
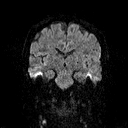
[im 36/48]
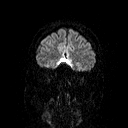
[im 48/48]
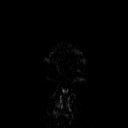

[48 of 48 positions shown; findings below may reference images not displayed]

FINDINGS: The ventricles and sulci are normal for patient's age. No abnormal
parenchymal signal, mass lesions, mass effect. No abnormal
parenchymal enhancement. No reduced diffusion to suggest acute
ischemia. No susceptibility artifact to suggest hemorrhage.

No abnormal extra-axial fluid collections. No extra-axial masses nor
leptomeningeal enhancement. Normal major intracranial vascular flow
voids seen at the skull base.

Ocular globes and orbital contents are unremarkable though not
tailored for evaluation. No abnormal sellar expansion. Visualized
paranasal sinuses and mastoid air cells are well-aerated. No
suspicious calvarial bone marrow signal. No abnormal sellar
expansion. Craniocervical junction maintained. Mild
temporomandibular osteoarthrosis with trace LEFT TMJ effusion.
IMPRESSION: Normal MRI of the brain with and without contrast.

  By: Miljan Nalon
